# Patient Record
Sex: Male | Born: 1980 | Race: Black or African American | Hispanic: No | Marital: Single | Smoking: Current every day smoker
Health system: Southern US, Community
[De-identification: ages and names within clinical notes are randomized; demographics above are authoritative.]

## PROBLEM LIST (undated history)

## (undated) DIAGNOSIS — J45909 Unspecified asthma, uncomplicated: Secondary | ICD-10-CM

---

## 2017-11-14 ENCOUNTER — Other Ambulatory Visit: Payer: Self-pay

## 2017-11-14 ENCOUNTER — Encounter (HOSPITAL_BASED_OUTPATIENT_CLINIC_OR_DEPARTMENT_OTHER): Payer: Self-pay

## 2017-11-14 ENCOUNTER — Emergency Department (HOSPITAL_BASED_OUTPATIENT_CLINIC_OR_DEPARTMENT_OTHER): Payer: Self-pay

## 2017-11-14 ENCOUNTER — Emergency Department (HOSPITAL_BASED_OUTPATIENT_CLINIC_OR_DEPARTMENT_OTHER)
Admission: EM | Admit: 2017-11-14 | Discharge: 2017-11-14 | Disposition: A | Discharge status: Home / Self Care | Payer: Self-pay | Attending: Emergency Medicine | Admitting: Emergency Medicine

## 2017-11-14 DIAGNOSIS — F17200 Nicotine dependence, unspecified, uncomplicated: Secondary | ICD-10-CM | POA: Insufficient documentation

## 2017-11-14 DIAGNOSIS — J209 Acute bronchitis, unspecified: Secondary | ICD-10-CM | POA: Insufficient documentation

## 2017-11-14 HISTORY — DX: Unspecified asthma, uncomplicated: J45.909

## 2017-11-14 MED ORDER — ALBUTEROL SULFATE HFA 108 (90 BASE) MCG/ACT IN AERS
2.0000 | INHALATION_SPRAY | RESPIRATORY_TRACT | Status: DC
  Administered 2017-11-14: 2 via RESPIRATORY_TRACT
  Filled 2017-11-14: qty 6.7

## 2017-11-14 MED ORDER — ALBUTEROL SULFATE HFA 108 (90 BASE) MCG/ACT IN AERS: 1.0000 | INHALATION_SPRAY | RESPIRATORY_TRACT | 0 refills | Status: AC | PRN

## 2017-11-14 MED ORDER — DOXYCYCLINE HYCLATE 100 MG PO CAPS: 100.0000 mg | ORAL_CAPSULE | Freq: Two times a day (BID) | ORAL | 0 refills | Status: AC

## 2017-11-14 MED ORDER — PREDNISONE 50 MG PO TABS
60.0000 mg | ORAL_TABLET | Freq: Once | ORAL | Status: AC
  Administered 2017-11-14: 60 mg via ORAL
  Filled 2017-11-14: qty 1

## 2017-11-14 NOTE — ED Provider Notes (Signed)
[facility] EMERGENCY DEPARTMENT Provider Note   CSN: [id number] Arrival date & time: 11/14/17  0840     History   Chief Complaint Chief Complaint  Patient presents with  . Cough    HPI George Adams is a 37 y.o. male.  HPI Patient is a 37 year old male who presents to the emergency department with complaints of cough and congestion over the past week.  He has a history of asthma.  He is out of his albuterol inhaler.  He continues to smoke tobacco products.  Denies documented fever but reports some chills this morning.  Reports mild shortness of breath.  Reports productive cough over the past week.  No orthopnea.  No unilateral leg swelling.  Symptoms are mild in severity.   Past Medical History:  Diagnosis Date  . Asthma     There are no active problems to display for this patient.   History reviewed. No pertinent surgical history.      Home Medications    Prior to Admission medications   Medication Sig Start Date End Date Taking? Authorizing Provider  albuterol (PROVENTIL HFA;VENTOLIN HFA) 108 (90 Base) MCG/ACT inhaler Inhale 1-2 puffs into the lungs every 4 (four) hours as needed for wheezing or shortness of breath. 11/14/17   Azalia Bilisampos, Shawnice Tilmon, MD  doxycycline (VIBRAMYCIN) 100 MG capsule Take 1 capsule (100 mg total) by mouth 2 (two) times daily. 11/14/17   Azalia Bilisampos, Earle Burson, MD    Family History No family history on file.  Social History Social History   Tobacco Use  . Smoking status: Current Every Day Smoker  . Smokeless tobacco: Never Used  Substance Use Topics  . Alcohol use: Not Currently  . Drug use: Not Currently     Allergies   Patient has no known allergies.   Review of Systems Review of Systems  All other systems reviewed and are negative.    Physical Exam Updated Vital Signs BP 134/83 (BP Location: Left Arm)   Pulse 83   Temp 98.4 F (36.9 C) (Oral)   Resp 18   Ht 6\' 2"  (1.88 m)   Wt 72.6 kg   SpO2 99%   BMI 20.54 kg/m    Physical Exam  Constitutional: He is oriented to person, place, and time. He appears well-developed and well-nourished.  HENT:  Head: Normocephalic and atraumatic.  Eyes: EOM are normal.  Neck: Normal range of motion.  Cardiovascular: Normal rate, regular rhythm, normal heart sounds and intact distal pulses.  Pulmonary/Chest: Effort normal and breath sounds normal. No respiratory distress.  Abdominal: Soft. He exhibits no distension. There is no tenderness.  Musculoskeletal: Normal range of motion.  Neurological: He is alert and oriented to person, place, and time.  Skin: Skin is warm and dry.  Psychiatric: He has a normal mood and affect. Judgment normal.  Nursing note and vitals reviewed.    ED Treatments / Results  Labs (all labs ordered are listed, but only abnormal results are displayed) Labs Reviewed - No data to display  EKG None  Radiology No results found.  Procedures Procedures (including critical care time)  Medications Ordered in ED Medications  albuterol (PROVENTIL HFA;VENTOLIN HFA) 108 (90 Base) MCG/ACT inhaler 2 puff (has no administration in time range)  predniSONE (DELTASONE) tablet 60 mg (has no administration in time range)     Initial Impression / Assessment and Plan / ED Course  I have reviewed the triage vital signs and the nursing notes.  Pertinent labs & imaging results that were  available during my care of the patient were reviewed by me and considered in my medical decision making (see chart for details).     Patient with acute bronchitis.  Chest x-ray without focal pneumonia.  No pneumothorax noted.  Chest x-ray personally reviewed by myself.  Patient be discharged home with antibiotics and bronchodilators.  Single dose of prednisone now.  Final Clinical Impressions(s) / ED Diagnoses   Final diagnoses:  Acute bronchitis, unspecified organism    ED Discharge Orders         Ordered    doxycycline (VIBRAMYCIN) 100 MG capsule  2 times  daily     11/14/17 0938    albuterol (PROVENTIL HFA;VENTOLIN HFA) 108 (90 Base) MCG/ACT inhaler  Every 4 hours PRN     11/14/17 0454           Azalia Bilis, MD 11/14/17 [phone]

## 2017-11-14 NOTE — ED Triage Notes (Signed)
Pt c/o cough and chest congestion x1wk

## 2018-01-04 ENCOUNTER — Other Ambulatory Visit: Payer: Self-pay

## 2018-01-04 ENCOUNTER — Emergency Department (HOSPITAL_BASED_OUTPATIENT_CLINIC_OR_DEPARTMENT_OTHER)
Admission: EM | Admit: 2018-01-04 | Discharge: 2018-01-04 | Disposition: A | Discharge status: Home / Self Care | Payer: Self-pay | Attending: Emergency Medicine | Admitting: Emergency Medicine

## 2018-01-04 ENCOUNTER — Encounter (HOSPITAL_BASED_OUTPATIENT_CLINIC_OR_DEPARTMENT_OTHER): Payer: Self-pay | Admitting: *Deleted

## 2018-01-04 DIAGNOSIS — Z202 Contact with and (suspected) exposure to infections with a predominantly sexual mode of transmission: Secondary | ICD-10-CM | POA: Insufficient documentation

## 2018-01-04 DIAGNOSIS — Z711 Person with feared health complaint in whom no diagnosis is made: Secondary | ICD-10-CM

## 2018-01-04 DIAGNOSIS — F17200 Nicotine dependence, unspecified, uncomplicated: Secondary | ICD-10-CM | POA: Insufficient documentation

## 2018-01-04 DIAGNOSIS — Z79899 Other long term (current) drug therapy: Secondary | ICD-10-CM | POA: Insufficient documentation

## 2018-01-04 DIAGNOSIS — J45909 Unspecified asthma, uncomplicated: Secondary | ICD-10-CM | POA: Insufficient documentation

## 2018-01-04 LAB — URINALYSIS, ROUTINE W REFLEX MICROSCOPIC
BILIRUBIN URINE: NEGATIVE
Glucose, UA: NEGATIVE mg/dL
KETONES UR: NEGATIVE mg/dL
LEUKOCYTES UA: NEGATIVE
NITRITE: NEGATIVE
Protein, ur: NEGATIVE mg/dL
Specific Gravity, Urine: 1.01 (ref 1.005–1.030)
pH: 6.5 (ref 5.0–8.0)

## 2018-01-04 LAB — URINALYSIS, MICROSCOPIC (REFLEX)
Squamous Epithelial / LPF: NONE SEEN (ref 0–5)
WBC, UA: NONE SEEN WBC/hpf (ref 0–5)

## 2018-01-04 MED ORDER — AZITHROMYCIN 250 MG PO TABS
1000.0000 mg | ORAL_TABLET | Freq: Once | ORAL | Status: AC
  Administered 2018-01-04: 1000 mg via ORAL
  Filled 2018-01-04: qty 4

## 2018-01-04 MED ORDER — CEFTRIAXONE SODIUM 250 MG IJ SOLR
250.0000 mg | Freq: Once | INTRAMUSCULAR | Status: AC
  Administered 2018-01-04: 250 mg via INTRAMUSCULAR
  Filled 2018-01-04: qty 250

## 2018-01-04 NOTE — Discharge Instructions (Signed)

## 2018-01-04 NOTE — ED Triage Notes (Signed)
STD exposure. Denies penile discharge.  

## 2018-01-04 NOTE — ED Provider Notes (Signed)
[facility] EMERGENCY DEPARTMENT Provider Note   CSN: [id number] Arrival date & time: 01/04/18  1931     History   Chief Complaint No chief complaint on file.   HPI George Adams is a 37 y.o. male who presents for evaluation of concern of STD.  Patient reports he had sexual intercourse with a new partner approximately 3 days ago and states that the condom broke during intercourse.  Patient states that he came to be checked "just to make sure everything is okay."  Patient states he has not had any dysuria, penile discharge.  Denies any fever, testicular pain or swelling.  The history is provided by the patient.    Past Medical History:  Diagnosis Date  . Asthma     There are no active problems to display for this patient.   History reviewed. No pertinent surgical history.      Home Medications    Prior to Admission medications   Medication Sig Start Date End Date Taking? Authorizing Provider  albuterol (PROVENTIL HFA;VENTOLIN HFA) 108 (90 Base) MCG/ACT inhaler Inhale 1-2 puffs into the lungs every 4 (four) hours as needed for wheezing or shortness of breath. 11/14/17   Azalia Bilis, MD  doxycycline (VIBRAMYCIN) 100 MG capsule Take 1 capsule (100 mg total) by mouth 2 (two) times daily. 11/14/17   Azalia Bilis, MD    Family History No family history on file.  Social History Social History   Tobacco Use  . Smoking status: Current Every Day Smoker  . Smokeless tobacco: Never Used  Substance Use Topics  . Alcohol use: Not Currently  . Drug use: Not Currently     Allergies   Patient has no known allergies.   Review of Systems Review of Systems  Constitutional: Negative for fever.  Genitourinary: Negative for discharge, dysuria, penile pain, penile swelling, scrotal swelling and testicular pain.  All other systems reviewed and are negative.    Physical Exam Updated Vital Signs BP 137/83   Pulse 89   Temp 98.4 F (36.9 C) (Oral)   Resp 20    Ht 6\' 2"  (1.88 m)   Wt 72.6 kg   SpO2 99%   BMI 20.54 kg/m   Physical Exam  Constitutional: He appears well-developed and well-nourished.  HENT:  Head: Normocephalic and atraumatic.  Eyes: Conjunctivae and EOM are normal. Right eye exhibits no discharge. Left eye exhibits no discharge. No scleral icterus.  Pulmonary/Chest: Effort normal.  Genitourinary: Testes normal and penis normal. Right testis shows no swelling and no tenderness. Left testis shows no swelling and no tenderness. Circumcised.  Genitourinary Comments: The exam was performed with a chaperone present. Normal male genitalia. No evidence of rash, ulcers or lesions.   Neurological: He is alert.  Skin: Skin is warm and dry.  Psychiatric: He has a normal mood and affect. His speech is normal and behavior is normal.  Nursing note and vitals reviewed.    ED Treatments / Results  Labs (all labs ordered are listed, but only abnormal results are displayed) Labs Reviewed  URINALYSIS, ROUTINE W REFLEX MICROSCOPIC - Abnormal; Notable for the following components:      Result Value   Hgb urine dipstick MODERATE (*)    All other components within normal limits  URINALYSIS, MICROSCOPIC (REFLEX) - Abnormal; Notable for the following components:   Bacteria, UA RARE (*)    All other components within normal limits  GC/CHLAMYDIA PROBE AMP ([place]) NOT AT [facility]    EKG None  Radiology No results found.  Procedures Procedures (including critical care time)  Medications Ordered in ED Medications  cefTRIAXone (ROCEPHIN) injection 250 mg (250 mg Intramuscular Given 01/04/18 2023)  azithromycin (ZITHROMAX) tablet 1,000 mg (1,000 mg Oral Given 01/04/18 2023)     Initial Impression / Assessment and Plan / ED Course  I have reviewed the triage vital signs and the nursing notes.  Pertinent labs & imaging results that were available during my care of the patient were reviewed by me and considered in my medical decision  making (see chart for details).     [age] year old male who presents for evaluation of STD.  Reports new partner 3 days ago and states that the condom broke during intercourse.  Came to be checked just "to make sure everything is okay."  No penile discharge, dysuria, hematuria. Patient is afebril, non-toxic appearing, sitting comfortably on examination table. Vital signs reviewed and stable.  GU exam is unremarkable.  Urine sent at triage.  GC/chlamydia sent.    UA shows moderate hemoglobin.  No evidence of leukocytes, nitrates, pyuria.  Discussed treatment options with patient.  Patient wishes to be treated today.  Encourage safe sex practices. Patient had ample opportunity for questions and discussion. All patient's questions were answered with full understanding. Strict return precautions discussed. Patient expresses understanding and agreement to plan.     Final Clinical Impressions(s) / ED Diagnoses   Final diagnoses:  Concern about STD in male without diagnosis    ED Discharge Orders    None       Maxwell CaulLayden, Lindsey A, PA-C 01/05/18 0111    Alvira MondaySchlossman, Erin, MD 01/05/18 1158

## 2018-01-05 LAB — GC/CHLAMYDIA PROBE AMP (~~LOC~~) NOT AT ARMC
CHLAMYDIA, DNA PROBE: POSITIVE — AB
NEISSERIA GONORRHEA: NEGATIVE

## 2018-11-19 ENCOUNTER — Emergency Department (HOSPITAL_COMMUNITY)
Admission: EM | Admit: 2018-11-19 | Discharge: 2018-11-20 | Discharge status: Against Medical Advice | Payer: Self-pay | Attending: Emergency Medicine | Admitting: Emergency Medicine

## 2018-11-19 DIAGNOSIS — Z5321 Procedure and treatment not carried out due to patient leaving prior to being seen by health care provider: Secondary | ICD-10-CM | POA: Insufficient documentation

## 2018-11-20 ENCOUNTER — Other Ambulatory Visit: Payer: Self-pay

## 2018-11-20 ENCOUNTER — Emergency Department (HOSPITAL_COMMUNITY): Payer: Self-pay

## 2018-11-20 ENCOUNTER — Encounter (HOSPITAL_COMMUNITY): Payer: Self-pay | Admitting: Emergency Medicine
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
## 2018-11-20 NOTE — ED Notes (Signed)
No answer for vitals recheck x2 

## 2018-11-20 NOTE — ED Notes (Signed)
No answer for vitals recheck x1 

## 2019-06-28 ENCOUNTER — Emergency Department (HOSPITAL_COMMUNITY)
Admission: EM | Admit: 2019-06-28 | Discharge: 2019-06-28 | Disposition: A | Discharge status: Home / Self Care | Payer: Self-pay | Attending: Emergency Medicine | Admitting: Emergency Medicine

## 2019-06-28 ENCOUNTER — Other Ambulatory Visit: Payer: Self-pay

## 2019-06-28 ENCOUNTER — Encounter (HOSPITAL_COMMUNITY): Payer: Self-pay

## 2019-06-28 ENCOUNTER — Emergency Department (HOSPITAL_COMMUNITY): Payer: Self-pay

## 2019-06-28 DIAGNOSIS — Y9389 Activity, other specified: Secondary | ICD-10-CM | POA: Insufficient documentation

## 2019-06-28 DIAGNOSIS — F17210 Nicotine dependence, cigarettes, uncomplicated: Secondary | ICD-10-CM | POA: Insufficient documentation

## 2019-06-28 DIAGNOSIS — Y990 Civilian activity done for income or pay: Secondary | ICD-10-CM | POA: Insufficient documentation

## 2019-06-28 DIAGNOSIS — Y929 Unspecified place or not applicable: Secondary | ICD-10-CM | POA: Insufficient documentation

## 2019-06-28 DIAGNOSIS — W208XXA Other cause of strike by thrown, projected or falling object, initial encounter: Secondary | ICD-10-CM | POA: Insufficient documentation

## 2019-06-28 DIAGNOSIS — S92425A Nondisplaced fracture of distal phalanx of left great toe, initial encounter for closed fracture: Secondary | ICD-10-CM | POA: Insufficient documentation

## 2019-06-28 MED ORDER — CYCLOBENZAPRINE HCL 10 MG PO TABS: 10.0000 mg | ORAL_TABLET | Freq: Every day | ORAL | 0 refills | Status: AC

## 2019-06-28 MED ORDER — NAPROXEN 250 MG PO TABS
500.0000 mg | ORAL_TABLET | Freq: Once | ORAL | Status: AC
  Administered 2019-06-28: 12:00:00 500 mg via ORAL
  Filled 2019-06-28: qty 2

## 2019-06-28 MED ORDER — ACETAMINOPHEN 325 MG PO TABS
325.0000 mg | ORAL_TABLET | Freq: Once | ORAL | Status: AC
  Administered 2019-06-28: 12:00:00 325 mg via ORAL
  Filled 2019-06-28: qty 1

## 2019-06-28 NOTE — Discharge Instructions (Addendum)
Per our discussion, I would recommend keeping weight off the left foot.  You have been provided a postop shoe as well as crutches to aid in this.  You can take Tylenol and ibuprofen as needed for management of your pain.  If you would prefer naproxen over ibuprofen that is fine.  Please follow the instructions on the bottle.  I have also given you a prescription for Flexeril.  This is a muscle relaxer but also has sedating effect.  You can take this once at night for help with pain as well as your difficulty sleeping.  Please do not mix this medication with alcohol.  Please do not operate a motor vehicle after taking this medication.  You have been given referral to [facility] health and wellness.  You can reach out to them if you find your symptoms or not improving.  You can also return to the emergency department if you have any new or worsening symptoms.  It was a pleasure to meet you.

## 2019-06-28 NOTE — ED Notes (Signed)
Pt discharge instructions reviewed with the patient. The patient verbalized understanding. Pt discharged. 

## 2019-06-28 NOTE — ED Notes (Signed)
X ray done in room

## 2019-06-28 NOTE — ED Triage Notes (Signed)
Pt dropped metal pallet on left foot while working yesterday. Bruising noted to left big toe, +ROM, pt ambulatory.

## 2019-06-28 NOTE — ED Provider Notes (Signed)
[facility] EMERGENCY DEPARTMENT Provider Note   CSN: [id number] Arrival date & time: 06/28/19  1049     History Chief Complaint  Patient presents with  . Foot Injury    George Adams is a 39 y.o. male.  HPI HPI Comments: George Adams is a 39 y.o. male who presents to the Emergency Department complaining of left great toe pain.  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  He had a sudden onset of pain in the toe.  He reports associated edema and ecchymosis in the toe.  Patient has exquisite pain along the distal phalanx of the great toe.  His pain worsens with any movement, ambulation, palpation.  He has not taken anything for his pain.  He denies numbness, tingling, chest pain, shortness of breath, weakness.    Past Medical History:  Diagnosis Date  . Asthma     There are no problems to display for this patient.   History reviewed. No pertinent surgical history.     No family history on file.  Social History   Tobacco Use  . Smoking status: Current Every Day Smoker  . Smokeless tobacco: Never Used  Substance Use Topics  . Alcohol use: Not Currently  . Drug use: Not Currently    Home Medications Prior to Admission medications   Medication Sig Start Date End Date Taking? Authorizing Provider  albuterol (PROVENTIL HFA;VENTOLIN HFA) 108 (90 Base) MCG/ACT inhaler Inhale 1-2 puffs into the lungs every 4 (four) hours as needed for wheezing or shortness of breath. 11/14/17   Azalia Bilis, MD  doxycycline (VIBRAMYCIN) 100 MG capsule Take 1 capsule (100 mg total) by mouth 2 (two) times daily. 11/14/17   Azalia Bilis, MD    Allergies    Patient has no known allergies.  Review of Systems   Review of Systems  Respiratory: Negative for shortness of breath.   Cardiovascular: Negative for chest pain.  Musculoskeletal: Positive for arthralgias and myalgias.  Skin: Positive for  color change. Negative for wound.  Neurological: Negative for weakness and numbness.   Physical Exam Updated Vital Signs There were no vitals taken for this visit.  Physical Exam Vitals and nursing note reviewed.  Constitutional:      General: He is in acute distress.     Appearance: Normal appearance. He is normal weight. He is not ill-appearing, toxic-appearing or diaphoretic.  HENT:     Head: Normocephalic and atraumatic.     Nose: Nose normal.     Mouth/Throat:     Pharynx: Oropharynx is clear.  Eyes:     Extraocular Movements: Extraocular movements intact.  Cardiovascular:     Rate and Rhythm: Normal rate and regular rhythm.     Pulses: Normal pulses.     Heart sounds: Normal heart sounds. No murmur. No friction rub. No gallop.   Pulmonary:     Effort: Pulmonary effort is normal. No respiratory distress.     Breath sounds: Normal breath sounds. No stridor. No wheezing, rhonchi or rales.  Abdominal:     General: Abdomen is flat.  Musculoskeletal:        General: Swelling, tenderness and signs of injury present. No deformity.     Cervical back: Normal range of motion.     Comments: Diffuse ecchymosis and edema noted to the distal phalanx of the left great toe.  Exquisite tenderness noted in the prior mentioned region.  Patient is able to flex and extend the affected toe but this is limited secondary to pain.  Good cap refill in the affected toe.  Patient has distal sensation intact in the affected toe.  Unable to assess gait secondary to pain.  Pedal pulses intact bilaterally.  Skin:    General: Skin is warm and dry.     Capillary Refill: Capillary refill takes less than 2 seconds.     Findings: Bruising present.  Neurological:     General: No focal deficit present.     Mental Status: He is alert and oriented to person, place, and time.  Psychiatric:        Mood and Affect: Mood normal.        Behavior: Behavior normal.    ED Results / Procedures / Treatments    Labs (all labs ordered are listed, but only abnormal results are displayed) Labs Reviewed - No data to display  EKG None  Radiology DG Foot Complete Left  Result Date: 06/28/2019 CLINICAL DATA:  Injured great toe yesterday EXAM: LEFT FOOT - COMPLETE 3+ VIEW COMPARISON:  None FINDINGS: Nondisplaced fracture of the distal first phalanx in the mid shaft. Fracture does not extend into the joint. No other fracture or arthropathy. IMPRESSION: Nondisplaced fracture first distal phalanx. Electronically Signed   By: Franchot Gallo M.D.   On: 06/28/2019 11:17   Procedures Procedures   Medications Ordered in ED Medications  acetaminophen (TYLENOL) tablet 325 mg (325 mg Oral Given 06/28/19 1145)  naproxen (NAPROSYN) tablet 500 mg (500 mg Oral Given 06/28/19 1146)    ED Course  I have reviewed the triage vital signs and the nursing notes.  Pertinent labs & imaging results that were available during my care of the patient were reviewed by me and considered in my medical decision making (see chart for details).    MDM Rules/Calculators/A&P                      Patient is a well-developed pleasant 39 year old male who presents with a nondisplaced fracture of the first distal phalanx on the left foot.  Physical exam is generally reassuring.  His pain, ecchymosis, edema is consistent with his diagnostic imaging.  Patient has some limited range of motion in the affected toe but this appears secondary to pain.  He is neurovascularly intact in the affected toe.  There is no cut or wound on the toe and the nailbed is intact and without injury.  I discussed the patient's diagnosis with him.  He understands he should remain nonweightbearing on the left foot.  I will place him in a postop shoe and gave him crutches.  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  I will also give him follow-up with [place] and wellness, since he does not have health insurance.  I recommended Tylenol  and ibuprofen or naproxen for management of his pain.  He was given a dose of naproxen and Tylenol here in the emergency department.  I additionally recommended applying ice as needed to the toe.  He understands he can return to the emergency department with any new or worsening symptoms.  He verbalized understanding of the above plan and was amicable at the time of discharge.  His vital signs are stable.  Patient discharged to home/self care.  Condition at discharge: Stable  Note: Portions of this report may have been transcribed using voice recognition software. Every  effort was made to ensure accuracy; however, inadvertent computerized transcription errors may be present.    Final Clinical Impression(s) / ED Diagnoses Final diagnoses:  Closed nondisplaced fracture of distal phalanx of left great toe, initial encounter    Rx / DC Orders ED Discharge Orders    None       Placido Sou, PA-C 06/28/19 1150    Terald Sleeper, MD 06/28/19 1901
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
## 2021-10-11 IMAGING — DX DG FOOT COMPLETE 3+V*L*
3 series · 3 of 3 positions shown · non-contrast
Comparison: None

CLINICAL DATA: Injured great toe yesterday

EXAM:
LEFT FOOT - COMPLETE 3+ VIEW

[foot ap]
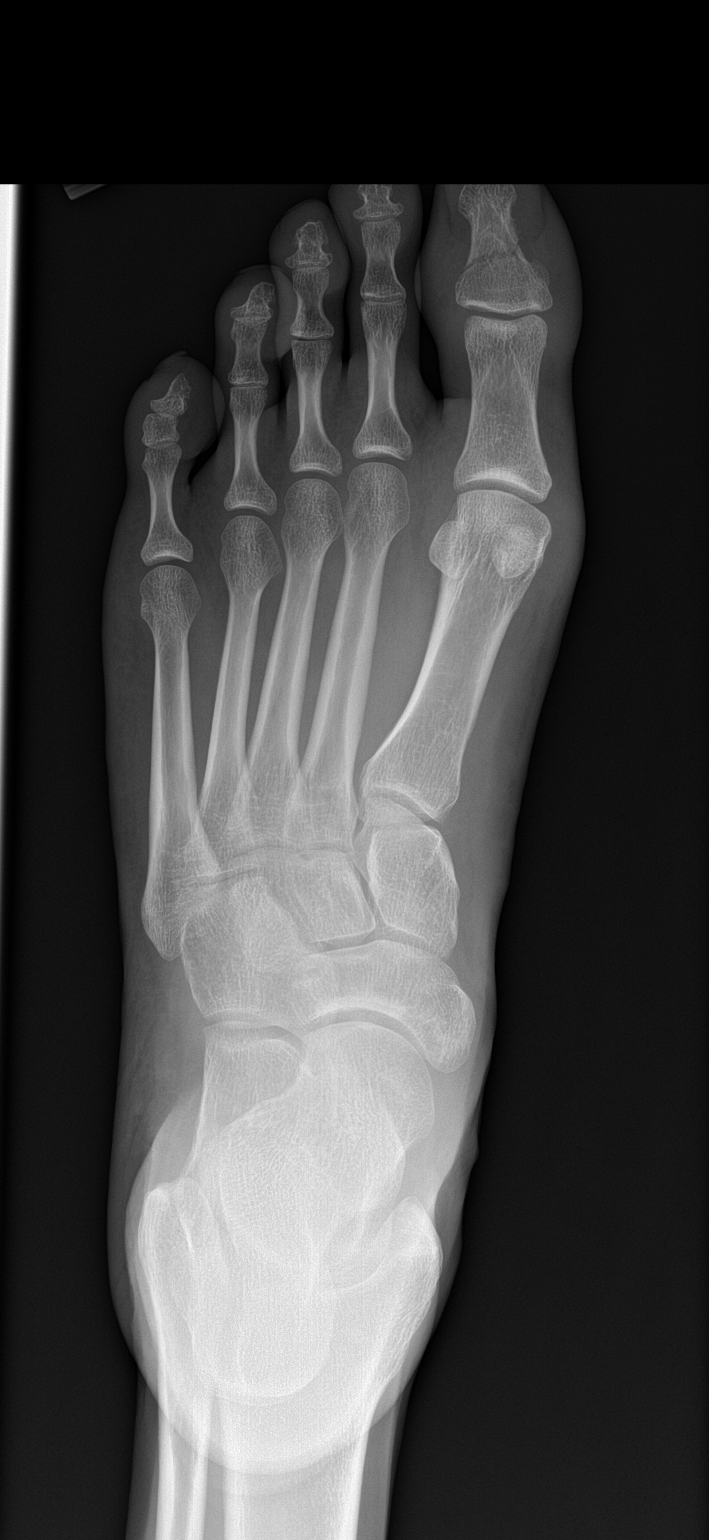

[foot obl]
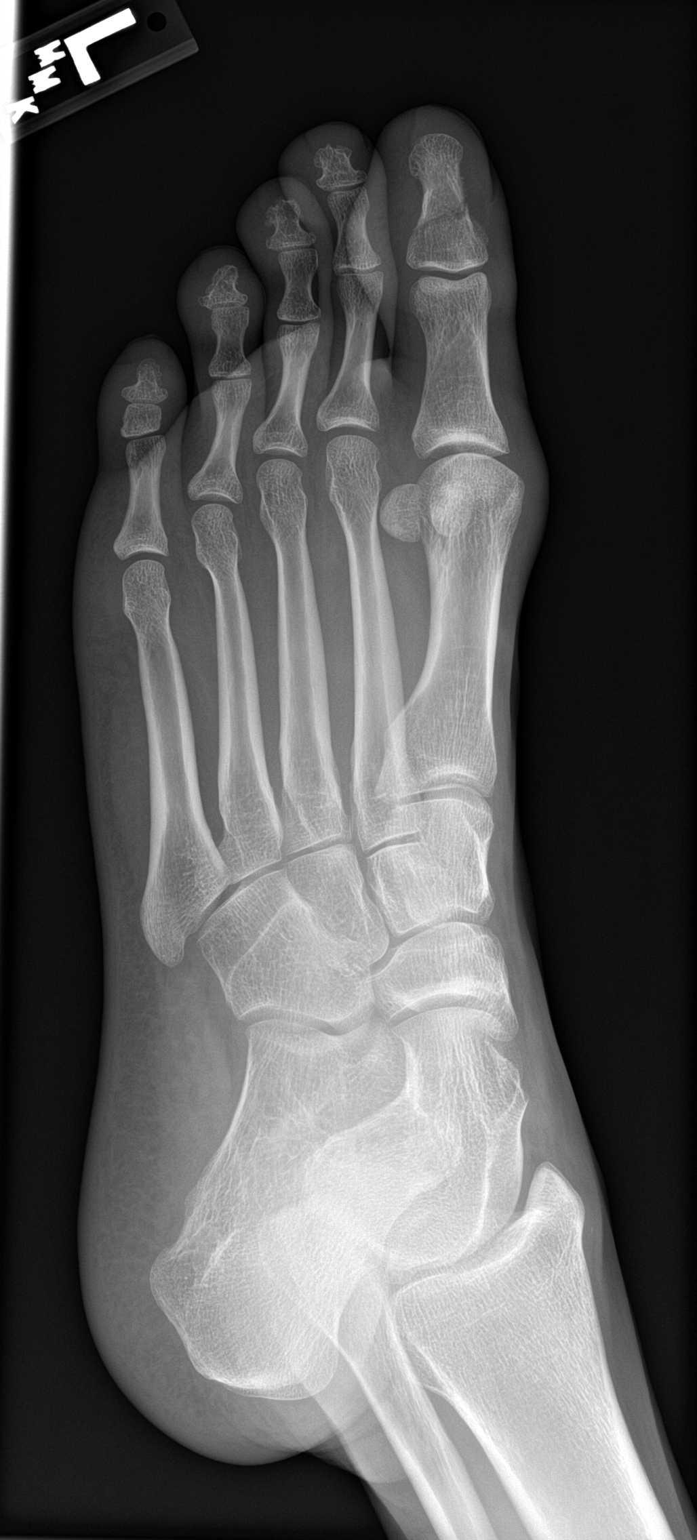

[foot lat]
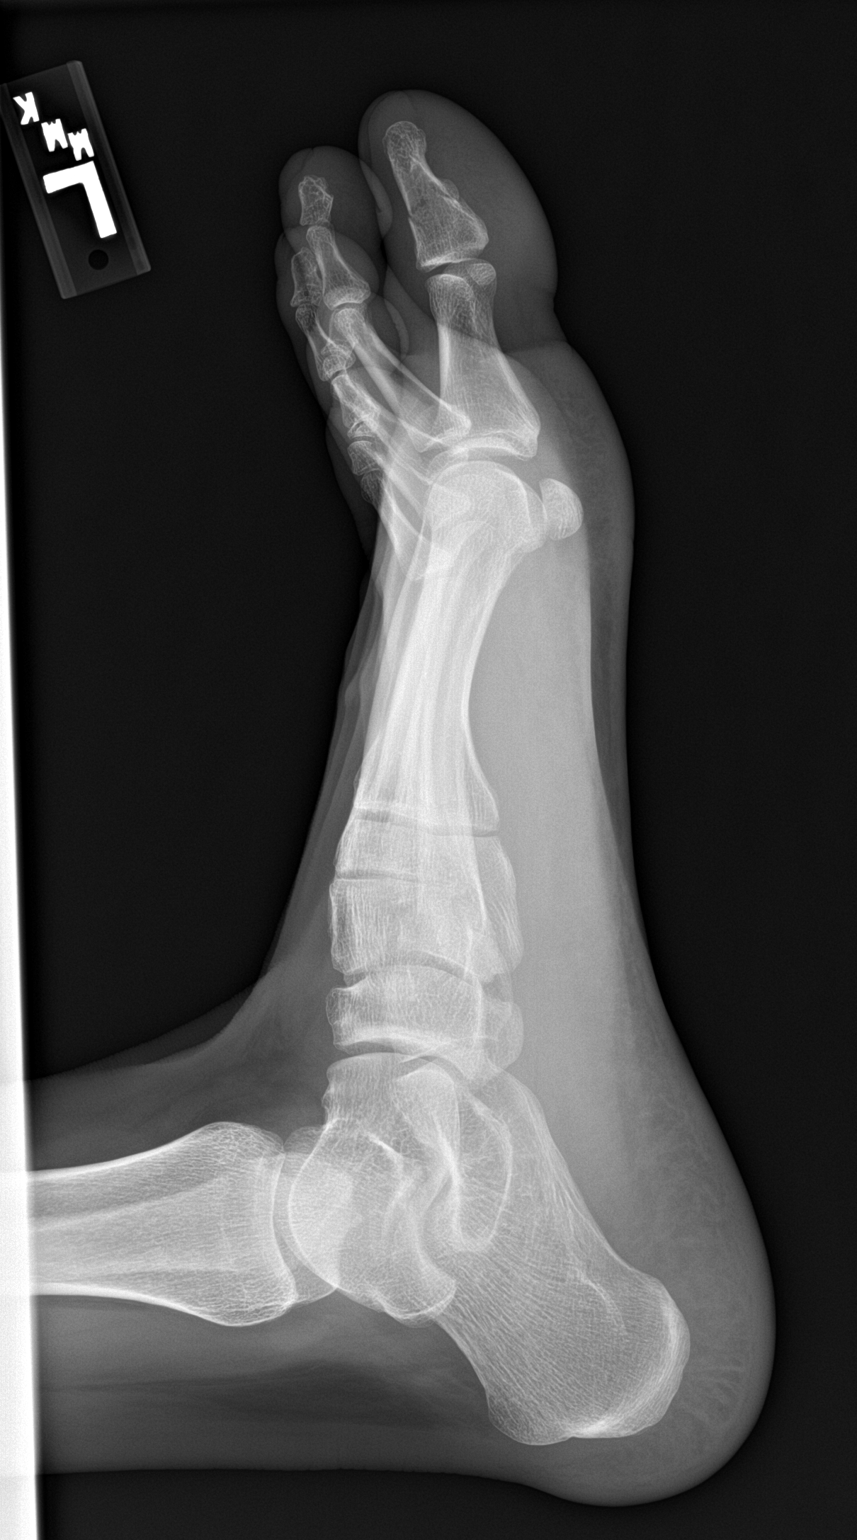

[3 of 3 positions shown; findings below may reference images not displayed]

FINDINGS: Nondisplaced fracture of the distal first phalanx in the mid shaft.
Fracture does not extend into the joint.

No other fracture or arthropathy.
IMPRESSION: Nondisplaced fracture first distal phalanx.
